# Patient Record
Sex: Female | Born: 1995 | Race: White | Hispanic: No | Marital: Single | Smoking: Never smoker
Health system: Southern US, Community
[De-identification: ages and names within clinical notes are randomized; demographics above are authoritative.]

## PROBLEM LIST (undated history)

## (undated) DIAGNOSIS — F419 Anxiety disorder, unspecified: Secondary | ICD-10-CM

## (undated) DIAGNOSIS — F319 Bipolar disorder, unspecified: Secondary | ICD-10-CM

## (undated) DIAGNOSIS — F502 Bulimia nervosa: Secondary | ICD-10-CM

## (undated) DIAGNOSIS — F4310 Post-traumatic stress disorder, unspecified: Secondary | ICD-10-CM

## (undated) HISTORY — PX: TONSILLECTOMY: SUR1361

---

## 2016-09-17 ENCOUNTER — Encounter: Payer: Self-pay | Admitting: Emergency Medicine

## 2016-09-17 ENCOUNTER — Emergency Department
Admission: EM | Admit: 2016-09-17 | Discharge: 2016-09-18 | Disposition: A | Discharge status: Home / Self Care | Payer: BLUE CROSS/BLUE SHIELD | Attending: Emergency Medicine | Admitting: Emergency Medicine

## 2016-09-17 DIAGNOSIS — T7840XA Allergy, unspecified, initial encounter: Secondary | ICD-10-CM | POA: Diagnosis present

## 2016-09-17 MED ORDER — PREDNISONE 50 MG PO TABS: 50.0000 mg | ORAL_TABLET | Freq: Every day | ORAL | 0 refills | Status: AC

## 2016-09-17 MED ORDER — FAMOTIDINE 20 MG PO TABS
40.0000 mg | ORAL_TABLET | Freq: Once | ORAL | Status: AC
  Administered 2016-09-17: 40 mg via ORAL
  Filled 2016-09-17: qty 2

## 2016-09-17 MED ORDER — DIPHENHYDRAMINE HCL 50 MG/ML IJ SOLN
50.0000 mg | Freq: Once | INTRAMUSCULAR | Status: AC
  Administered 2016-09-17: 50 mg via INTRAMUSCULAR
  Filled 2016-09-17: qty 1

## 2016-09-17 MED ORDER — METHYLPREDNISOLONE SODIUM SUCC 125 MG IJ SOLR
125.0000 mg | Freq: Once | INTRAMUSCULAR | Status: AC
  Administered 2016-09-17: 125 mg via INTRAMUSCULAR
  Filled 2016-09-17: qty 2

## 2016-09-17 NOTE — ED Notes (Signed)
Pt reports hx of multiple food allergies. Pt reports eating at approx 1200pm today. Pt had tingling/swollen lips, and hives to edges of lip line. Pt took 2 benadryl at 1230, and again ay 1930 with no relief of itching. Pt denies throat tightness/difficulty breathing and/or swallowing.

## 2016-09-17 NOTE — ED Triage Notes (Signed)
Patient ambulatory to triage with steady gait, without difficulty or distress noted; pt reports since about noon had rash to face and tingling to upper lip; st possible reaction to fennel?; benadryl taken at 730pm, 2 tab with significant relief; denies any generalized rash/itching, diff swallowing or breathing

## 2016-09-17 NOTE — ED Notes (Signed)
Pt's upper lip is visibly swollen upon assessment

## 2016-09-17 NOTE — ED Provider Notes (Signed)
[facility] Emergency Department Provider Note  ____________________________________________  Time seen: Approximately 10:51 PM  I have reviewed the triage vital signs and the nursing notes.   HISTORY  Chief Complaint Allergic Reaction    HPI Mackenzie Vasquez is a 20 y.o. female who presents emergency department complaining of allergic reaction. Patient states that she has food allergies and believes she has contact with one of her allergens today. Patient states that she ate out at [employer] and then started to develop symptoms several hours later.Patient states that she had rash to face and tingling and mild swelling to the upper lip. She denies any angioedema. She denies any difficulty breathing or wheezing. Patient is tried 50 mg of Benadryl without significant improvement. No other complaints at this time.   History reviewed. No pertinent past medical history.  There are no active problems to display for this patient.   Past Surgical History:  Procedure Laterality Date  . TONSILLECTOMY      Prior to Admission medications   Medication Sig Start Date End Date Taking? Authorizing Provider  predniSONE (DELTASONE) 50 MG tablet Take 1 tablet (50 mg total) by mouth daily with breakfast. 09/17/16   Delorise Royals Heavenleigh Petruzzi, PA-C    Allergies Apple; Carrot [daucus carota]; Celery oil; Fennel [trigonella foenum-graecum]; Other; Pear; and Raspberry  No family history on file.  Social History Social History  Substance Use Topics  . Smoking status: Never Smoker  . Smokeless tobacco: Never Used  . Alcohol use No     Review of Systems  Constitutional: No fever/chills Eyes: No visual changes. No discharge ENT: No upper respiratory complaints. Cardiovascular: no chest pain. Respiratory: no cough. No SOB. Gastrointestinal: No abdominal pain.  No nausea, no vomiting.   Musculoskeletal: Negative for musculoskeletal pain. Skin: Negative for rash,  abrasions, lacerations, ecchymosis.Positive for rash to face and swelling of upper lip. Neurological: Negative for headaches, focal weakness or numbness. 10-point ROS otherwise negative.  ____________________________________________   PHYSICAL EXAM:  VITAL SIGNS: ED Triage Vitals [09/17/16 2141]  Enc Vitals Group     BP 140/77     Pulse Rate 92     Resp 18     Temp 98.4 F (36.9 C)     Temp Source Oral     SpO2 99 %     Weight 136 lb (61.7 kg)     Height 5\' 6"  (1.676 m)     Head Circumference      Peak Flow      Pain Score      Pain Loc      Pain Edu?      Excl. in GC?      Constitutional: Alert and oriented. Well appearing and in no acute distress. Eyes: Conjunctivae are normal. PERRL. EOMI. Head: Atraumatic. ENT:      Ears:       Nose: No congestion/rhinnorhea.      Mouth/Throat: Mucous membranes are moist. Oropharynx is nonerythematous and nonedematous. No signs of angioedema. Neck: No stridor.    Cardiovascular: Normal rate, regular rhythm. Normal S1 and S2.  Good peripheral circulation. Respiratory: Normal respiratory effort without tachypnea or retractions. Lungs CTAB. Good air entry to the bases with no decreased or absent breath sounds. Musculoskeletal: Full range of motion to all extremities. No gross deformities appreciated. Neurologic:  Normal speech and language. No gross focal neurologic deficits are appreciated.  Skin:  Skin is warm, dry and intact. No rash noted. Eyes are noted to bilateral cheeks.  Upper lip is mildly edematous. No defined hives. Oropharyngeal exam is unremarkable. Psychiatric: Mood and affect are normal. Speech and behavior are normal. Patient exhibits appropriate insight and judgement.   ____________________________________________   LABS (all labs ordered are listed, but only abnormal results are displayed)  Labs Reviewed - No data to  display ____________________________________________  EKG   ____________________________________________  RADIOLOGY   No results found.  ____________________________________________    PROCEDURES  Procedure(s) performed:    Procedures    Medications  diphenhydrAMINE (BENADRYL) injection 50 mg (50 mg Intramuscular Given 09/17/16 2257)  methylPREDNISolone sodium succinate (SOLU-MEDROL) 125 mg/2 mL injection 125 mg (125 mg Intramuscular Given 09/17/16 2256)  famotidine (PEPCID) tablet 40 mg (40 mg Oral Given 09/17/16 2257)     ____________________________________________   INITIAL IMPRESSION / ASSESSMENT AND PLAN / ED COURSE  Pertinent labs & imaging results that were available during my care of the patient were reviewed by me and considered in my medical decision making (see chart for details).  Review of the [place] CSRS was performed in accordance of the [facility] prior to dispensing any controlled drugs.  Clinical Course    Patient's diagnosis is consistent with allergic reaction. This is moderate in nature. Patient was given Solu-Medrol, Benadryl, famotidine in the emergency department. No complications.. Patient will be discharged home with prescriptions for oral steroids. Patient is also continue to take Benadryl as needed.. Patient is to follow up with primary care as needed or otherwise directed. Patient is given ED precautions to return to the ED for any worsening or new symptoms.     ____________________________________________  FINAL CLINICAL IMPRESSION(S) / ED DIAGNOSES  Final diagnoses:  Allergic reaction, initial encounter      NEW MEDICATIONS STARTED DURING THIS VISIT:  New Prescriptions   PREDNISONE (DELTASONE) 50 MG TABLET    Take 1 tablet (50 mg total) by mouth daily with breakfast.        This chart was dictated using voice recognition software/Dragon. Despite best efforts to proofread, errors can occur which can change the meaning. Any  change was purely unintentional.    Racheal PatchesJonathan D Valta Dillon, PA-C 09/17/16 2357    Minna AntisKevin Paduchowski, MD 09/18/16 2120

## 2016-11-09 ENCOUNTER — Encounter: Payer: Self-pay | Admitting: Medical Oncology

## 2016-11-09 ENCOUNTER — Emergency Department: Payer: BLUE CROSS/BLUE SHIELD

## 2016-11-09 ENCOUNTER — Emergency Department
Admission: EM | Admit: 2016-11-09 | Discharge: 2016-11-09 | Disposition: A | Discharge status: Home / Self Care | Payer: BLUE CROSS/BLUE SHIELD | Attending: Emergency Medicine | Admitting: Emergency Medicine

## 2016-11-09 DIAGNOSIS — M25571 Pain in right ankle and joints of right foot: Secondary | ICD-10-CM | POA: Diagnosis not present

## 2016-11-09 DIAGNOSIS — M25561 Pain in right knee: Secondary | ICD-10-CM | POA: Insufficient documentation

## 2016-11-09 DIAGNOSIS — Y999 Unspecified external cause status: Secondary | ICD-10-CM | POA: Insufficient documentation

## 2016-11-09 DIAGNOSIS — Y92410 Unspecified street and highway as the place of occurrence of the external cause: Secondary | ICD-10-CM | POA: Insufficient documentation

## 2016-11-09 DIAGNOSIS — S99911A Unspecified injury of right ankle, initial encounter: Secondary | ICD-10-CM | POA: Diagnosis present

## 2016-11-09 DIAGNOSIS — Y939 Activity, unspecified: Secondary | ICD-10-CM | POA: Insufficient documentation

## 2016-11-09 HISTORY — DX: Anxiety disorder, unspecified: F41.9

## 2016-11-09 MED ORDER — IBUPROFEN 400 MG PO TABS
600.0000 mg | ORAL_TABLET | Freq: Once | ORAL | Status: AC
  Administered 2016-11-09: 600 mg via ORAL
  Filled 2016-11-09: qty 2
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
## 2016-11-09 NOTE — ED Notes (Signed)
NAD noted at time of D/C. Pt denies questions or concerns. Pt ambulatory to the lobby at this time.  Pt refused wheelchair to the lobby at this time.  

## 2016-11-09 NOTE — Discharge Instructions (Signed)
Take ibuprofen 600 mg every 6-8 hours as needed for pain. Take it with meals to prevent stomach upset. Do not bear weight until you are pain-free. Use the splint on your ankle until the swelling has resolved. Follow-up in student health in 2 days. Follow-up with orthopedics in a week.
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
## 2017-02-03 ENCOUNTER — Encounter: Payer: Self-pay | Admitting: *Deleted

## 2017-02-03 ENCOUNTER — Emergency Department
Admission: EM | Admit: 2017-02-03 | Discharge: 2017-02-03 | Disposition: A | Discharge status: Home / Self Care | Payer: BLUE CROSS/BLUE SHIELD | Attending: Emergency Medicine | Admitting: Emergency Medicine

## 2017-02-03 DIAGNOSIS — R55 Syncope and collapse: Secondary | ICD-10-CM | POA: Diagnosis not present

## 2017-02-03 HISTORY — DX: Bulimia nervosa: F50.2

## 2017-02-03 HISTORY — DX: Bipolar disorder, unspecified: F31.9

## 2017-02-03 HISTORY — DX: Post-traumatic stress disorder, unspecified: F43.10

## 2017-02-03 LAB — CBC
HCT: 37.7 % (ref 35.0–47.0)
HEMOGLOBIN: 12.7 g/dL (ref 12.0–16.0)
MCH: 26.2 pg (ref 26.0–34.0)
MCHC: 33.7 g/dL (ref 32.0–36.0)
MCV: 77.7 fL — ABNORMAL LOW (ref 80.0–100.0)
Platelets: 311 10*3/uL (ref 150–440)
RBC: 4.85 MIL/uL (ref 3.80–5.20)
RDW: 16.5 % — AB (ref 11.5–14.5)
WBC: 9.8 10*3/uL (ref 3.6–11.0)

## 2017-02-03 LAB — URINALYSIS, COMPLETE (UACMP) WITH MICROSCOPIC
BACTERIA UA: NONE SEEN
Bilirubin Urine: NEGATIVE
GLUCOSE, UA: NEGATIVE mg/dL
Ketones, ur: 20 mg/dL — AB
Leukocytes, UA: NEGATIVE
Nitrite: NEGATIVE
PROTEIN: NEGATIVE mg/dL
Specific Gravity, Urine: 1.012 (ref 1.005–1.030)
pH: 8 (ref 5.0–8.0)

## 2017-02-03 LAB — POCT PREGNANCY, URINE: PREG TEST UR: NEGATIVE

## 2017-02-03 LAB — BASIC METABOLIC PANEL
ANION GAP: 10 (ref 5–15)
BUN: 8 mg/dL (ref 6–20)
CALCIUM: 9.9 mg/dL (ref 8.9–10.3)
CO2: 24 mmol/L (ref 22–32)
Chloride: 102 mmol/L (ref 101–111)
Creatinine, Ser: 0.7 mg/dL (ref 0.44–1.00)
GFR calc Af Amer: >60 mL/min (ref >60)
GFR calc non Af Amer: >60 mL/min (ref >60)
GLUCOSE: 100 mg/dL — AB (ref 65–99)
Potassium: 3.3 mmol/L — ABNORMAL LOW (ref 3.5–5.1)
Sodium: 136 mmol/L (ref 135–145)

## 2017-02-03 MED ORDER — SODIUM CHLORIDE 0.9 % IV BOLUS (SEPSIS)
1000.0000 mL | Freq: Once | INTRAVENOUS | Status: AC
  Administered 2017-02-03: 1000 mL via INTRAVENOUS

## 2017-02-03 NOTE — ED Notes (Signed)
Patient refusing to get out of wheelchair at this time. Patient states, "If I stand up, i'll pass out". Patient here for near syncopal episode today while in class. A&O x4.

## 2017-02-03 NOTE — ED Triage Notes (Addendum)
Pt arrived via EMS from [place] pt has a ? Near syncopal episode while at class today, pt reports increased anxiety last night, pt hasn't eaten since yesterday, pt as  A history of anxiety, syncopal episodes, pt denies and other symptoms

## 2017-02-03 NOTE — ED Notes (Signed)
Waiting on fluids to finish for discharge 

## 2017-02-03 NOTE — ED Provider Notes (Signed)
[facility] Emergency Department Provider Note  Time seen: 2:36 PM  I have reviewed the triage vital signs and the nursing notes.   HISTORY  Chief Complaint Near Syncope    HPI Kenlee Vogt is a 21 y.o. female with a past medical history of anxiety, bipolar, bulimia, presents to the emergency department after a near syncopal event. According to the patient she ate some last night but did not eat much. Did not drink or eat anything today. Patient states she was feeling somewhat lightheaded when she went to class today. Her friend asked her if she wanted to go get some fresh air so she stood up to leave class and upon standing had a brief syncopal versus near syncopal event. Patient states she has passed out approximate 5 times previously. Patient admits she has not drink in or eaten anything today. Denies any chest pain or shortness of breath at any point. Currently she states she feels a little lightheaded but otherwise back to normal as long as she is lying flat.  Past Medical History:  Diagnosis Date  . Anxiety   . Anxiety   . Bipolar 1 disorder (HCC)   . Bulimia   . PTSD (post-traumatic stress disorder)     There are no active problems to display for this patient.   Past Surgical History:  Procedure Laterality Date  . TONSILLECTOMY      Prior to Admission medications   Medication Sig Start Date End Date Taking? Authorizing Provider  predniSONE (DELTASONE) 50 MG tablet Take 1 tablet (50 mg total) by mouth daily with breakfast. 09/17/16   Delorise Royals Cuthriell, PA-C    Allergies  Allergen Reactions  . Apple   . Carrot [Daucus Carota]   . Celery Oil   . Fennel [Trigonella Foenum-Graecum]   . Other     Potato   . Pear   . Raspberry     No family history on file.  Social History Social History  Substance Use Topics  . Smoking status: Never Smoker  . Smokeless tobacco: Never Used  . Alcohol use No    Review of Systems Constitutional:  Negative for fever. Cardiovascular: Negative for chest pain. Respiratory: Negative for shortness of breath. Gastrointestinal: Negative for abdominal pain Genitourinary: Negative for dysuria. Currently on her period. Neurological: Negative for headache 10-point ROS otherwise negative.  ____________________________________________   PHYSICAL EXAM:  VITAL SIGNS: ED Triage Vitals [02/03/17 1327]  Enc Vitals Group     BP 129/77     Pulse Rate 77     Resp 20     Temp 98 F (36.7 C)     Temp Source Oral     SpO2 100 %     Weight 125 lb (56.7 kg)     Height 5\' 6"  (1.676 m)     Head Circumference      Peak Flow      Pain Score      Pain Loc      Pain Edu?      Excl. in GC?     Constitutional: Alert and oriented. Well appearing and in no distress. Eyes: Normal exam ENT   Head: Normocephalic and atraumatic.   Mouth/Throat: Mucous membranes are moist. Cardiovascular: Normal rate, regular rhythm. No murmur Respiratory: Normal respiratory effort without tachypnea nor retractions. Breath sounds are clear  Gastrointestinal: Soft and nontender. No distention.  Musculoskeletal: Nontender with normal range of motion in all extremities. Neurologic:  Normal speech and language. No gross  focal neurologic deficits  Skin:  Skin is warm, dry and intact.  Psychiatric: Mood and affect are normal.   ____________________________________________    EKG  EKG reviewed and interpreted by myself shows normal sinus rhythm at 82 bpm, narrow QRS, normal axis, normal intervals no concerning ST changes noted.  ____________________________________________   INITIAL IMPRESSION / ASSESSMENT AND PLAN / ED COURSE  Pertinent labs & imaging results that were available during my care of the patient were reviewed by me and considered in my medical decision making (see chart for details).  The patient presents to the emergency department with a near syncopal versus brief syncopal event. Patient's  story is very consistent with orthostatic hypotension/orthostatic syncope. Patient currently denies any symptoms but states she will get lightheaded if she tries to sit or stand up. We'll place an IV, IV hydrate and closely monitor in the emergency department. Patient's initial blood work is normal, urinalysis and pregnancy test pending.  Patient's urinalysis is within normal limits as the patient is currently on her period. Pregnancy test is negative. There is a mild amount of ketones and the urinalysis. We will IV hydrate with 2 L in the emergency department and discharged with PCP follow-up.  ____________________________________________   FINAL CLINICAL IMPRESSION(S) / ED DIAGNOSES  Orthostatic syncope    Minna AntisKevin Dravyn Severs, MD 02/03/17 1606
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
